# Patient Record
Sex: Female | Born: 1950
Health system: Southern US, Community
[De-identification: ages and names within clinical notes are randomized; demographics above are authoritative.]

## PROBLEM LIST (undated history)

## (undated) DIAGNOSIS — E785 Hyperlipidemia, unspecified: Secondary | ICD-10-CM

## (undated) DIAGNOSIS — E669 Obesity, unspecified: Secondary | ICD-10-CM

## (undated) DIAGNOSIS — K219 Gastro-esophageal reflux disease without esophagitis: Secondary | ICD-10-CM

## (undated) DIAGNOSIS — T7840XA Allergy, unspecified, initial encounter: Secondary | ICD-10-CM

## (undated) HISTORY — DX: Hyperlipidemia, unspecified: E78.5

## (undated) HISTORY — DX: Allergy, unspecified, initial encounter: T78.40XA

## (undated) HISTORY — DX: Obesity, unspecified: E66.9

## (undated) HISTORY — DX: Gastro-esophageal reflux disease without esophagitis: K21.9

## (undated) HISTORY — PX: HERNIA REPAIR: SHX51

---

## 1981-02-17 HISTORY — PX: ABDOMINAL HYSTERECTOMY: SHX81

## 2008-02-03 ENCOUNTER — Ambulatory Visit: Payer: Self-pay | Admitting: Family Medicine

## 2008-02-15 ENCOUNTER — Ambulatory Visit: Payer: Self-pay | Admitting: Gastroenterology

## 2008-02-18 HISTORY — PX: COLONOSCOPY: SHX174

## 2008-03-02 ENCOUNTER — Ambulatory Visit: Payer: Self-pay | Admitting: Gastroenterology

## 2008-03-02 LAB — HM COLONOSCOPY: HM Colonoscopy: NORMAL

## 2008-07-20 ENCOUNTER — Ambulatory Visit: Payer: Self-pay | Admitting: Family Medicine

## 2008-11-09 ENCOUNTER — Ambulatory Visit: Payer: Self-pay | Admitting: Family Medicine

## 2009-03-13 ENCOUNTER — Emergency Department (HOSPITAL_COMMUNITY): Admission: EM | Admit: 2009-03-13 | Discharge: 2009-03-13 | Payer: Self-pay | Admitting: Emergency Medicine

## 2009-08-28 ENCOUNTER — Ambulatory Visit: Payer: Self-pay | Admitting: Family Medicine

## 2010-02-25 LAB — HM MAMMOGRAPHY: HM Mammogram: NEGATIVE

## 2010-09-01 ENCOUNTER — Other Ambulatory Visit: Payer: Self-pay | Admitting: Family Medicine

## 2010-09-17 ENCOUNTER — Other Ambulatory Visit: Payer: Self-pay | Admitting: Family Medicine

## 2010-10-15 ENCOUNTER — Other Ambulatory Visit: Payer: Self-pay | Admitting: Family Medicine

## 2010-10-22 ENCOUNTER — Encounter: Payer: Self-pay | Admitting: Family Medicine

## 2010-10-24 ENCOUNTER — Ambulatory Visit (INDEPENDENT_AMBULATORY_CARE_PROVIDER_SITE_OTHER): Payer: Managed Care, Other (non HMO) | Admitting: Family Medicine

## 2010-10-24 ENCOUNTER — Encounter: Payer: Self-pay | Admitting: Family Medicine

## 2010-10-24 VITALS — BP 148/90 | HR 78 | Wt 226.0 lb

## 2010-10-24 DIAGNOSIS — K219 Gastro-esophageal reflux disease without esophagitis: Secondary | ICD-10-CM | POA: Insufficient documentation

## 2010-10-24 DIAGNOSIS — J309 Allergic rhinitis, unspecified: Secondary | ICD-10-CM

## 2010-10-24 DIAGNOSIS — E785 Hyperlipidemia, unspecified: Secondary | ICD-10-CM

## 2010-10-24 DIAGNOSIS — E669 Obesity, unspecified: Secondary | ICD-10-CM

## 2010-10-24 DIAGNOSIS — Z79899 Other long term (current) drug therapy: Secondary | ICD-10-CM

## 2010-10-24 LAB — CBC WITH DIFFERENTIAL/PLATELET
Basophils Absolute: 0.1 10*3/uL (ref 0.0–0.1)
Eosinophils Absolute: 0.1 10*3/uL (ref 0.0–0.7)
Eosinophils Relative: 3 % (ref 0–5)
Lymphocytes Relative: 26 % (ref 12–46)
Lymphs Abs: 1.1 10*3/uL (ref 0.7–4.0)
Neutro Abs: 2.6 10*3/uL (ref 1.7–7.7)
Neutrophils Relative %: 60 % (ref 43–77)
Platelets: 268 10*3/uL (ref 150–400)
RBC: 4.73 MIL/uL (ref 3.87–5.11)
WBC: 4.4 10*3/uL (ref 4.0–10.5)

## 2010-10-24 NOTE — Progress Notes (Signed)
  Subjective:    Patient ID: Sarah Dominguez, female    DOB: December 03, 1950, 60 y.o.   MRN: [id number]  HPI She is here for a complete examination. She does complain of some slight discomfort in the right third finger at the DIP joint. She also continues on Nexium and notes that she can get 3 days worth of use out of before the symptoms started to recur. Her allergies seem to be under good control. Her exercise is quite minimal. She does seem to keep his he with her daily activities. Her marriage is going well. She does wear her seatbelt.   Review of Systems  Constitutional: Negative.   HENT: Negative.   Eyes: Negative.   Respiratory: Negative.   Cardiovascular: Negative.   Gastrointestinal: Negative.   Genitourinary: Negative.   Musculoskeletal: Negative.   Skin: Negative.   Neurological: Negative.   Hematological: Negative.   Psychiatric/Behavioral: Negative.        Objective:   Physical Exam BP 148/90  Pulse 78  Wt 226 lb (102.513 kg)  General Appearance:    Alert, cooperative, no distress, appears stated age  Head:    Normocephalic, without obvious abnormality, atraumatic  Eyes:    PERRL, conjunctiva/corneas clear, EOM's intact, fundi    benign  Ears:    Normal TM's and external ear canals  Nose:   Nares normal, mucosa normal, no drainage or sinus   tenderness  Throat:   Lips, mucosa, and tongue normal; teeth and gums normal  Neck:   Supple, no lymphadenopathy;  thyroid:  no   enlargement/tenderness/nodules; no carotid   bruit or JVD  Back:    Spine nontender, no curvature, ROM normal, no CVA     tenderness  Lungs:     Clear to auscultation bilaterally without wheezes, rales or     ronchi; respirations unlabored  Chest Wall:    No tenderness or deformity   Heart:    Regular rate and rhythm, S1 and S2 normal, no murmur, rub   or gallop  Breast Exam:    Deferred to GYN  Abdomen:     Soft, non-tender, nondistended, normoactive bowel sounds,    no masses, no hepatosplenomegaly   Genitalia:    Deferred to GYN     Extremities:   No clubbing, cyanosis or edema  Pulses:   2+ and symmetric all extremities  Skin:   Skin color, texture, turgor normal, no rashes or lesions  Lymph nodes:   Cervical, supraclavicular, and axillary nodes normal  Neurologic:   CNII-XII intact, normal strength, sensation and gait; reflexes 2+ and symmetric throughout          Psych:   Normal mood, affect, hygiene and grooming.           Assessment & Plan:   1. Hyperlipidemia LDL goal < 100  Lipid panel  2. GERD (gastroesophageal reflux disease)    3. Allergic rhinitis    4. Obesity (BMI 30-39.9)    5. Encounter for long-term (current) use of other medications  CBC with Differential, Comp Met (CMET), Lipid panel

## 2010-10-24 NOTE — Patient Instructions (Signed)
Use the Nexium every 3 days to help with your symptoms.

## 2010-10-25 ENCOUNTER — Telehealth: Payer: Self-pay

## 2010-10-25 LAB — COMPREHENSIVE METABOLIC PANEL
CO2: 26 mEq/L (ref 19–32)
Calcium: 10.3 mg/dL (ref 8.4–10.5)
Chloride: 106 mEq/L (ref 96–112)
Creat: 0.89 mg/dL (ref 0.50–1.10)
Glucose, Bld: 99 mg/dL (ref 70–99)
Sodium: 142 mEq/L (ref 135–145)
Total Bilirubin: 0.4 mg/dL (ref 0.3–1.2)
Total Protein: 7 g/dL (ref 6.0–8.3)

## 2010-10-25 LAB — LIPID PANEL
Cholesterol: 208 mg/dL — ABNORMAL HIGH (ref 0–200)
HDL: 59 mg/dL (ref >39)
Triglycerides: 113 mg/dL (ref <150)

## 2010-10-25 NOTE — Telephone Encounter (Signed)
Pt informed labs ok 

## 2010-10-28 ENCOUNTER — Other Ambulatory Visit: Payer: Self-pay | Admitting: Family Medicine

## 2010-10-30 ENCOUNTER — Telehealth: Payer: Self-pay | Admitting: Family Medicine

## 2010-10-31 ENCOUNTER — Other Ambulatory Visit: Payer: Self-pay

## 2010-10-31 MED ORDER — SIMVASTATIN 40 MG PO TABS: 40.0000 mg | ORAL_TABLET | Freq: Every day | ORAL | Status: DC

## 2010-11-12 NOTE — Telephone Encounter (Signed)
DONE

## 2010-11-29 ENCOUNTER — Other Ambulatory Visit: Payer: Self-pay | Admitting: Family Medicine

## 2010-12-09 ENCOUNTER — Other Ambulatory Visit: Payer: Self-pay | Admitting: Family Medicine

## 2011-01-27 ENCOUNTER — Other Ambulatory Visit: Payer: Self-pay | Admitting: Family Medicine

## 2011-03-29 ENCOUNTER — Other Ambulatory Visit: Payer: Self-pay | Admitting: Family Medicine

## 2011-06-24 ENCOUNTER — Other Ambulatory Visit: Payer: Self-pay | Admitting: Family Medicine

## 2011-10-22 ENCOUNTER — Other Ambulatory Visit: Payer: Self-pay | Admitting: Family Medicine

## 2011-12-05 ENCOUNTER — Other Ambulatory Visit: Payer: Self-pay | Admitting: Family Medicine

## 2011-12-29 ENCOUNTER — Telehealth: Payer: Self-pay | Admitting: Family Medicine

## 2011-12-29 MED ORDER — ESOMEPRAZOLE MAGNESIUM 40 MG PO CPDR: 40.0000 mg | DELAYED_RELEASE_CAPSULE | Freq: Every day | ORAL | Status: DC

## 2011-12-29 MED ORDER — SIMVASTATIN 40 MG PO TABS: 40.0000 mg | ORAL_TABLET | Freq: Every day | ORAL | Status: DC

## 2011-12-29 NOTE — Telephone Encounter (Signed)
Med sent.

## 2012-01-08 ENCOUNTER — Encounter: Payer: Self-pay | Admitting: Family Medicine

## 2012-01-08 ENCOUNTER — Ambulatory Visit (INDEPENDENT_AMBULATORY_CARE_PROVIDER_SITE_OTHER): Payer: Managed Care, Other (non HMO) | Admitting: Family Medicine

## 2012-01-08 VITALS — BP 118/80 | HR 74 | Ht 68.0 in | Wt 224.0 lb

## 2012-01-08 DIAGNOSIS — Z2911 Encounter for prophylactic immunotherapy for respiratory syncytial virus (RSV): Secondary | ICD-10-CM

## 2012-01-08 DIAGNOSIS — E785 Hyperlipidemia, unspecified: Secondary | ICD-10-CM

## 2012-01-08 DIAGNOSIS — J309 Allergic rhinitis, unspecified: Secondary | ICD-10-CM

## 2012-01-08 DIAGNOSIS — K219 Gastro-esophageal reflux disease without esophagitis: Secondary | ICD-10-CM

## 2012-01-08 DIAGNOSIS — Z0000 Encounter for general adult medical examination without abnormal findings: Secondary | ICD-10-CM

## 2012-01-08 DIAGNOSIS — E669 Obesity, unspecified: Secondary | ICD-10-CM

## 2012-01-08 LAB — LIPID PANEL
HDL: 63 mg/dL (ref >39)
LDL Cholesterol: 138 mg/dL — ABNORMAL HIGH (ref 0–99)

## 2012-01-08 LAB — COMPREHENSIVE METABOLIC PANEL
ALT: 17 U/L (ref 0–35)
BUN: 19 mg/dL (ref 6–23)
CO2: 30 mEq/L (ref 19–32)
Chloride: 105 mEq/L (ref 96–112)
Creat: 0.93 mg/dL (ref 0.50–1.10)
Glucose, Bld: 82 mg/dL (ref 70–99)
Total Protein: 6.9 g/dL (ref 6.0–8.3)

## 2012-01-08 LAB — CBC WITH DIFFERENTIAL/PLATELET
Eosinophils Absolute: 0.1 10*3/uL (ref 0.0–0.7)
Eosinophils Relative: 2 % (ref 0–5)
HCT: 42.9 % (ref 36.0–46.0)
Hemoglobin: 14.4 g/dL (ref 12.0–15.0)
Lymphs Abs: 1.3 10*3/uL (ref 0.7–4.0)
MCH: 30.9 pg (ref 26.0–34.0)
Monocytes Relative: 10 % (ref 3–12)
Neutro Abs: 3.1 10*3/uL (ref 1.7–7.7)
Neutrophils Relative %: 61 % (ref 43–77)
RBC: 4.66 MIL/uL (ref 3.87–5.11)

## 2012-01-08 NOTE — Progress Notes (Signed)
  Subjective:    Patient ID: Sarah Dominguez, female    DOB: [birth year], 61 y.o.   MRN: [id number]  HPI She is here for complete examination. She continues on Zocor and is having no difficulty with this. She does use Nexium but notes that she can get by using it less than every day. She continues on omega-3. Her allergies are under good control. She is slowly losing weight making changes especially to her exercise regimen. She sees a gynecologist regularly. Last colonoscopy was 2010.   Review of Systems  Constitutional: Negative.   HENT: Negative.   Eyes: Negative.   Respiratory: Negative.   Cardiovascular: Negative.   Gastrointestinal: Negative.   Genitourinary: Negative.   Musculoskeletal: Negative.   Hematological: Negative.   Psychiatric/Behavioral: Negative.        Objective:   Physical Exam BP 118/80  Pulse 74  Ht 5\' 8"  (1.727 m)  Wt 224 lb (101.606 kg)  BMI 34.06 kg/m2  SpO2 98%  General Appearance:    Alert, cooperative, no distress, appears stated age  Head:    Normocephalic, without obvious abnormality, atraumatic  Eyes:    PERRL, conjunctiva/corneas clear, EOM's intact, fundi    benign  Ears:    Normal TM's and external ear canals  Nose:   Nares normal, mucosa normal, no drainage or sinus   tenderness  Throat:   Lips, mucosa, and tongue normal; teeth and gums normal  Neck:   Supple, no lymphadenopathy;  thyroid:  no   enlargement/tenderness/nodules; no carotid   bruit or JVD  Back:    Spine nontender, no curvature, ROM normal, no CVA     tenderness  Lungs:     Clear to auscultation bilaterally without wheezes, rales or     ronchi; respirations unlabored  Chest Wall:    No tenderness or deformity   Heart:    Regular rate and rhythm, S1 and S2 normal, no murmur, rub   or gallop  Breast Exam:    Deferred to GYN  Abdomen:     Soft, non-tender, nondistended, normoactive bowel sounds,    no masses, no hepatosplenomegaly  Genitalia:    Deferred to GYN       Extremities:   No clubbing, cyanosis or edema  Pulses:   2+ and symmetric all extremities  Skin:   Skin color, texture, turgor normal, no rashes or lesions  Lymph nodes:   Cervical, supraclavicular, and axillary nodes normal  Neurologic:   CNII-XII intact, normal strength, sensation and gait; reflexes 2+ and symmetric throughout          Psych:   Normal mood, affect, hygiene and grooming.           Assessment & Plan:   1. Routine general medical examination at a health care facility  Varicella-zoster vaccine subcutaneous, Lipid panel, CBC with Differential, Comprehensive metabolic panel  2. GERD (gastroesophageal reflux disease)    3. Obesity (BMI 30-39.9)    4. Hyperlipidemia LDL goal < 100  Lipid panel  5. Allergic rhinitis     shingles vaccine given with discussion of risks and benefits. Recommend she use Nexium more on an as-needed basis. Encouraged her to continue with her weight reduction. Discussed diet and exercise especially in regard to cutting back on carbohydrates. Continue to treat her allergies as needed. Stool cards given.

## 2012-01-09 MED ORDER — ATORVASTATIN CALCIUM 40 MG PO TABS: 40.0000 mg | ORAL_TABLET | Freq: Every day | ORAL | Status: DC

## 2012-01-09 NOTE — Addendum Note (Signed)
Addended by: Ronnald Nian on: 01/09/2012 08:43 AM   Modules accepted: Orders

## 2012-01-09 NOTE — Progress Notes (Signed)
Quick Note:  Her lipids are not at goal. I called in a different for her to be taking and have her return here in 2 months ______

## 2012-01-09 NOTE — Progress Notes (Signed)
Quick Note:  Called pt home # left message her lipids are not a goal JCL had called in a different med for her to start taking and for her to return in 2 mths ______

## 2012-02-26 ENCOUNTER — Encounter: Payer: Self-pay | Admitting: Internal Medicine

## 2012-03-09 ENCOUNTER — Other Ambulatory Visit: Payer: Self-pay | Admitting: Family Medicine

## 2012-03-11 ENCOUNTER — Encounter: Payer: Self-pay | Admitting: Family Medicine

## 2012-03-11 ENCOUNTER — Ambulatory Visit (INDEPENDENT_AMBULATORY_CARE_PROVIDER_SITE_OTHER): Payer: BC Managed Care – PPO | Admitting: Family Medicine

## 2012-03-11 VITALS — BP 138/88 | HR 72 | Ht 68.0 in | Wt 227.0 lb

## 2012-03-11 DIAGNOSIS — Z79899 Other long term (current) drug therapy: Secondary | ICD-10-CM

## 2012-03-11 DIAGNOSIS — E785 Hyperlipidemia, unspecified: Secondary | ICD-10-CM

## 2012-03-11 LAB — LIPID PANEL
HDL: 59 mg/dL (ref >39)
Total CHOL/HDL Ratio: 2.7 Ratio

## 2012-03-11 NOTE — Progress Notes (Signed)
  Subjective:    Patient ID: Sarah Dominguez, female    DOB: Jan 09, 1951, 62 y.o.   MRN: [id number]  HPI She is here for a medication check. On her last visit her lipid panel was not at goal. She was switched to Lipitor 40 mg. She does not complain of weakness, muscle aches or neurologic symptoms. Also discussed flu shot and she declined.  Review of Systems     Objective:   Physical Exam Alert and in no distress       Assessment & Plan:   1. Hyperlipidemia LDL goal < 100  Lipid panel, Comprehensive metabolic panel  2. Encounter for long-term (current) use of other medications  Lipid panel, Comprehensive metabolic panel

## 2012-03-12 LAB — COMPREHENSIVE METABOLIC PANEL
ALT: 18 U/L (ref 0–35)
Albumin: 4.6 g/dL (ref 3.5–5.2)
Alkaline Phosphatase: 72 U/L (ref 39–117)
CO2: 27 mEq/L (ref 19–32)
Glucose, Bld: 105 mg/dL — ABNORMAL HIGH (ref 70–99)
Potassium: 4.8 mEq/L (ref 3.5–5.3)
Sodium: 142 mEq/L (ref 135–145)
Total Bilirubin: 0.5 mg/dL (ref 0.3–1.2)
Total Protein: 6.8 g/dL (ref 6.0–8.3)

## 2012-03-12 NOTE — Progress Notes (Signed)
Quick Note:  CALLED PT HOME # PT WAS ADVISED OF BLOOD WORK LOOKS GOOD CONTINUE ON PRESENT MEDS PT VERBALIZED UNDERSTANDING ______

## 2012-04-03 ENCOUNTER — Other Ambulatory Visit: Payer: Self-pay

## 2012-05-04 ENCOUNTER — Other Ambulatory Visit: Payer: Self-pay | Admitting: Family Medicine

## 2012-06-10 ENCOUNTER — Other Ambulatory Visit: Payer: Self-pay | Admitting: Family Medicine

## 2012-09-13 ENCOUNTER — Other Ambulatory Visit: Payer: Self-pay | Admitting: Family Medicine

## 2012-10-04 ENCOUNTER — Emergency Department (HOSPITAL_COMMUNITY)
Admission: EM | Admit: 2012-10-04 | Discharge: 2012-10-04 | Disposition: A | Discharge status: Home / Self Care | Payer: BC Managed Care – PPO | Attending: Emergency Medicine | Admitting: Emergency Medicine

## 2012-10-04 ENCOUNTER — Encounter (HOSPITAL_COMMUNITY): Payer: Self-pay | Admitting: Emergency Medicine

## 2012-10-04 ENCOUNTER — Emergency Department (HOSPITAL_COMMUNITY): Payer: BC Managed Care – PPO

## 2012-10-04 ENCOUNTER — Ambulatory Visit: Payer: Self-pay | Admitting: Family Medicine

## 2012-10-04 ENCOUNTER — Telehealth (HOSPITAL_COMMUNITY): Payer: Self-pay | Admitting: *Deleted

## 2012-10-04 DIAGNOSIS — E669 Obesity, unspecified: Secondary | ICD-10-CM | POA: Insufficient documentation

## 2012-10-04 DIAGNOSIS — M549 Dorsalgia, unspecified: Secondary | ICD-10-CM | POA: Insufficient documentation

## 2012-10-04 DIAGNOSIS — K219 Gastro-esophageal reflux disease without esophagitis: Secondary | ICD-10-CM | POA: Insufficient documentation

## 2012-10-04 DIAGNOSIS — R059 Cough, unspecified: Secondary | ICD-10-CM | POA: Insufficient documentation

## 2012-10-04 DIAGNOSIS — Z79899 Other long term (current) drug therapy: Secondary | ICD-10-CM | POA: Insufficient documentation

## 2012-10-04 DIAGNOSIS — E785 Hyperlipidemia, unspecified: Secondary | ICD-10-CM | POA: Insufficient documentation

## 2012-10-04 DIAGNOSIS — R0789 Other chest pain: Secondary | ICD-10-CM | POA: Insufficient documentation

## 2012-10-04 DIAGNOSIS — R05 Cough: Secondary | ICD-10-CM | POA: Insufficient documentation

## 2012-10-04 LAB — COMPREHENSIVE METABOLIC PANEL
BUN: 18 mg/dL (ref 6–23)
CO2: 26 mEq/L (ref 19–32)
Chloride: 104 mEq/L (ref 96–112)
Creatinine, Ser: 0.78 mg/dL (ref 0.50–1.10)
GFR calc Af Amer: >90 mL/min (ref >90)
GFR calc non Af Amer: 88 mL/min — ABNORMAL LOW (ref >90)
Total Bilirubin: 0.4 mg/dL (ref 0.3–1.2)

## 2012-10-04 LAB — CBC WITH DIFFERENTIAL/PLATELET
Basophils Absolute: 0.1 10*3/uL (ref 0.0–0.1)
Basophils Relative: 1 % (ref 0–1)
HCT: 41.5 % (ref 36.0–46.0)
Hemoglobin: 14.4 g/dL (ref 12.0–15.0)
Lymphocytes Relative: 24 % (ref 12–46)
MCHC: 34.7 g/dL (ref 30.0–36.0)
Monocytes Absolute: 0.6 10*3/uL (ref 0.1–1.0)
Neutro Abs: 2.7 10*3/uL (ref 1.7–7.7)
Neutrophils Relative %: 59 % (ref 43–77)
RDW: 12.6 % (ref 11.5–15.5)
WBC: 4.6 10*3/uL (ref 4.0–10.5)

## 2012-10-04 LAB — URINALYSIS, ROUTINE W REFLEX MICROSCOPIC
Bilirubin Urine: NEGATIVE
Hgb urine dipstick: NEGATIVE
Specific Gravity, Urine: 1.011 (ref 1.005–1.030)
pH: 6 (ref 5.0–8.0)

## 2012-10-04 LAB — D-DIMER, QUANTITATIVE: D-Dimer, Quant: 0.29 ug/mL-FEU (ref 0.00–0.48)

## 2012-10-04 MED ORDER — CYCLOBENZAPRINE HCL 10 MG PO TABS: 5.0000 mg | ORAL_TABLET | Freq: Two times a day (BID) | ORAL | Status: DC | PRN

## 2012-10-04 MED ORDER — MORPHINE SULFATE 4 MG/ML IJ SOLN
4.0000 mg | Freq: Once | INTRAMUSCULAR | Status: AC
  Administered 2012-10-04: 4 mg via INTRAVENOUS
  Filled 2012-10-04: qty 1

## 2012-10-04 MED ORDER — TRAMADOL HCL 50 MG PO TABS: 50.0000 mg | ORAL_TABLET | Freq: Four times a day (QID) | ORAL | Status: DC | PRN

## 2012-10-04 MED ORDER — IBUPROFEN 600 MG PO TABS: 600.0000 mg | ORAL_TABLET | Freq: Four times a day (QID) | ORAL | Status: DC | PRN

## 2012-10-04 NOTE — ED Provider Notes (Signed)
CSN: [id number]     Arrival date & time 10/04/12  4098 History     First MD Initiated Contact with Patient 10/04/12 [phone]     Chief Complaint  Patient presents with  . Flank Pain   (Consider location/radiation/quality/duration/timing/severity/associated sxs/prior Treatment) Patient is a 62 y.o. female presenting with flank pain. The history is provided by the patient.  Flank Pain This is a new problem. Episode onset: 4 days ago. The problem occurs constantly. The problem has been unchanged. Associated symptoms include coughing. Pertinent negatives include no abdominal pain, chest pain, chills, congestion, diaphoresis, fatigue, fever, headaches, nausea, neck pain, numbness, rash, urinary symptoms, vomiting or weakness. The symptoms are aggravated by bending. She has tried nothing for the symptoms. The treatment provided no relief.    Past Medical History  Diagnosis Date  . Allergy     RHINITIS  . GERD (gastroesophageal reflux disease)   . Dyslipidemia   . Obesity    Past Surgical History  Procedure Laterality Date  . Abdominal hysterectomy  1983  . Hernia repair  1978/1984  . Colonoscopy  2010    Dr.kaplan   Family History  Problem Relation Age of Onset  . Kidney disease Mother     Renal failure  . Cancer Father 54    Non-Hodgkin's lymphoma   History  Substance Use Topics  . Smoking status: Never Smoker   . Smokeless tobacco: Never Used  . Alcohol Use: 1.8 oz/week    3 Glasses of wine per week   OB History   Grav Para Term Preterm Abortions TAB SAB Ect Mult Living                 Review of Systems  Constitutional: Negative for fever, chills, diaphoresis, appetite change and fatigue.  HENT: Negative for congestion, neck pain and neck stiffness.   Respiratory: Positive for cough. Negative for chest tightness, shortness of breath and wheezing.   Cardiovascular: Negative for chest pain, palpitations and leg swelling.  Gastrointestinal: Negative for nausea, vomiting,  abdominal pain and abdominal distention.  Genitourinary: Positive for flank pain. Negative for dysuria, urgency, frequency, hematuria, decreased urine volume and difficulty urinating.  Musculoskeletal: Positive for back pain. Negative for gait problem.  Skin: Negative for rash.  Neurological: Negative for dizziness, syncope, weakness, light-headedness, numbness and headaches.  All other systems reviewed and are negative.    Allergies  Review of patient's allergies indicates no known allergies.  Home Medications   Current Outpatient Rx  Name  Route  Sig  Dispense  Refill  . atorvastatin (LIPITOR) 40 MG tablet   Oral   Take 40 mg by mouth daily.         Marland Kitchen esomeprazole (NEXIUM) 40 MG capsule   Oral   Take 40 mg by mouth daily before breakfast.         . cyclobenzaprine (FLEXERIL) 10 MG tablet   Oral   Take 0.5 tablets (5 mg total) by mouth 2 (two) times daily as needed for muscle spasms.   10 tablet   0   . ibuprofen (ADVIL,MOTRIN) 600 MG tablet   Oral   Take 1 tablet (600 mg total) by mouth every 6 (six) hours as needed for pain.   30 tablet   0   . traMADol (ULTRAM) 50 MG tablet   Oral   Take 1 tablet (50 mg total) by mouth every 6 (six) hours as needed for pain.   10 tablet   0    BP  136/54  Pulse 67  Temp(Src) 97.8 F (36.6 C) (Oral)  Resp 14  Ht 5\' 9"  (1.753 m)  Wt 223 lb (101.152 kg)  BMI 32.92 kg/m2  SpO2 96% Physical Exam  Nursing note and vitals reviewed. Constitutional: She is oriented to person, place, and time. She appears well-developed and well-nourished. No distress.  HENT:  Head: Normocephalic and atraumatic.  Eyes: EOM are normal. Pupils are equal, round, and reactive to light.  Neck: Normal range of motion. Neck supple.  Cardiovascular: Normal rate, regular rhythm, normal heart sounds and intact distal pulses.   Pulmonary/Chest: Effort normal and breath sounds normal. No respiratory distress. She has no decreased breath sounds. She has no  wheezes. She has no rhonchi. She has no rales.   She exhibits tenderness.    Abdominal: Soft. Bowel sounds are normal. She exhibits no distension and no mass. There is no tenderness. There is no rebound and no guarding.  Musculoskeletal: Normal range of motion. She exhibits no edema and no tenderness.  Neurological: She is alert and oriented to person, place, and time. She has normal strength. She exhibits normal muscle tone. Coordination and gait normal.  Skin: Skin is warm and dry. No rash noted. She is not diaphoretic.    ED Course   Procedures (including critical care time)  Labs Reviewed  CBC WITH DIFFERENTIAL - Abnormal; Notable for the following:    Monocytes Relative 13 (*)    All other components within normal limits  COMPREHENSIVE METABOLIC PANEL - Abnormal; Notable for the following:    GFR calc non Af Amer 88 (*)    All other components within normal limits  URINALYSIS, ROUTINE W REFLEX MICROSCOPIC - Abnormal; Notable for the following:    Leukocytes, UA TRACE (*)    All other components within normal limits  URINE MICROSCOPIC-ADD ON  D-DIMER, QUANTITATIVE   Dg Chest 2 View  10/04/2012   *RADIOLOGY REPORT*  Clinical Data: Left chest pain  CHEST - 2 VIEW  Comparison: None.  Findings: The lungs are clear without focal infiltrate, edema, pneumothorax or pleural effusion. Interstitial markings are diffusely coarsened with chronic features. The cardiopericardial silhouette is within normal limits for size. Imaged bony structures of the thorax are intact.  IMPRESSION: No acute cardiopulmonary process.   Original Report Authenticated By: Kennith Center, M.D.   1. Musculoskeletal chest pain      Date: 10/04/2012  Rate: 61  Rhythm: normal sinus rhythm  QRS Axis: normal  Intervals: normal  ST/T Wave abnormalities: nonspecific ST changes  Conduction Disutrbances:none  Narrative Interpretation:   Old EKG Reviewed: none available   MDM  62 y.o. F presenting with left flank  pain.  Symptom onset 4 days ago.  Pt states she was lifting weights 4 days ago, and felt pain following this.  She states was not lifting more weight than usual.  Pain worsened over past 2 days so pt presented to ED.  Pain located in left anterior and posterior lower ribs.  Pain worse with deep breaths and movement.  She reports mild cough productive of yellow sputum.  No fevers.  No chest pain, no N/V.  No dysuria.  Pt does state that she went on car trip to [place] recently.  She denies LE swelling or pain.  No prior h/o VTE.  Physical exam with no tachycardia, tachypnea, or hypoxia.  Lower anterior, lateral, and posterior rib tenderness to palpation.  Lungs CTAB.  Abdomen soft, non-distended, non-tender.   Possible PNA vs PE vs  nephrolithiasis vs MSK pain.  History atypical for ACS, will check EKG.  D-dimer and CXR ordered.  U/A ordered to assess for kidney stone or UTI.  No rashes on exam to suggest shingles.  EKG with no evidence of acute ischemia.  CXR WNL.  U/A negative for blood or UTI.  D-dimer negative.  Symptoms likely 2/2 MSK chest pain following lifting weights.  Ultram, flexeril, ibuprofen prescribed.  ED return precautions given.  No further questions or concerns, stable at discharge.  Discussed with attending Dr. Ranae Palms.  Jodean Lima, MD 10/04/12 [phone]

## 2012-10-04 NOTE — ED Notes (Signed)
MD at bedside. 

## 2012-10-04 NOTE — ED Notes (Signed)
Received pt from home with c/o left side/flank pain since Friday. Pain increases with movement and deep inspiration.

## 2012-10-05 NOTE — ED Provider Notes (Signed)
Pt with several days of L lower rib pain, worse with movement and palpation. No def trauma. Pain did start after working out on Thursday and worsened when picking up her granddaughter yesterday. No cough, SOB, fever, chills, lower ext swelling or pain. Work up neg including normal d-dimer. Suspect MSK cause to pain. Will treat symptomatically. Return precautions given  Loren Racer, MD 10/05/12 [phone]

## 2012-12-23 ENCOUNTER — Other Ambulatory Visit: Payer: Self-pay

## 2013-04-18 ENCOUNTER — Telehealth: Payer: Self-pay | Admitting: Family Medicine

## 2013-04-18 MED ORDER — ESOMEPRAZOLE MAGNESIUM 40 MG PO CPDR: 40.0000 mg | DELAYED_RELEASE_CAPSULE | Freq: Every day | ORAL | Status: DC

## 2013-04-18 NOTE — Telephone Encounter (Signed)
Medication sent in. 

## 2013-08-22 LAB — HM MAMMOGRAPHY

## 2013-08-23 ENCOUNTER — Encounter: Payer: Self-pay | Admitting: Internal Medicine

## 2013-10-21 IMAGING — CR DG CHEST 2V
2 series · 2 of 2 positions shown · non-contrast
Comparison: None.

CLINICAL DATA: Left chest pain

CHEST - 2 VIEW

[w chest pa]
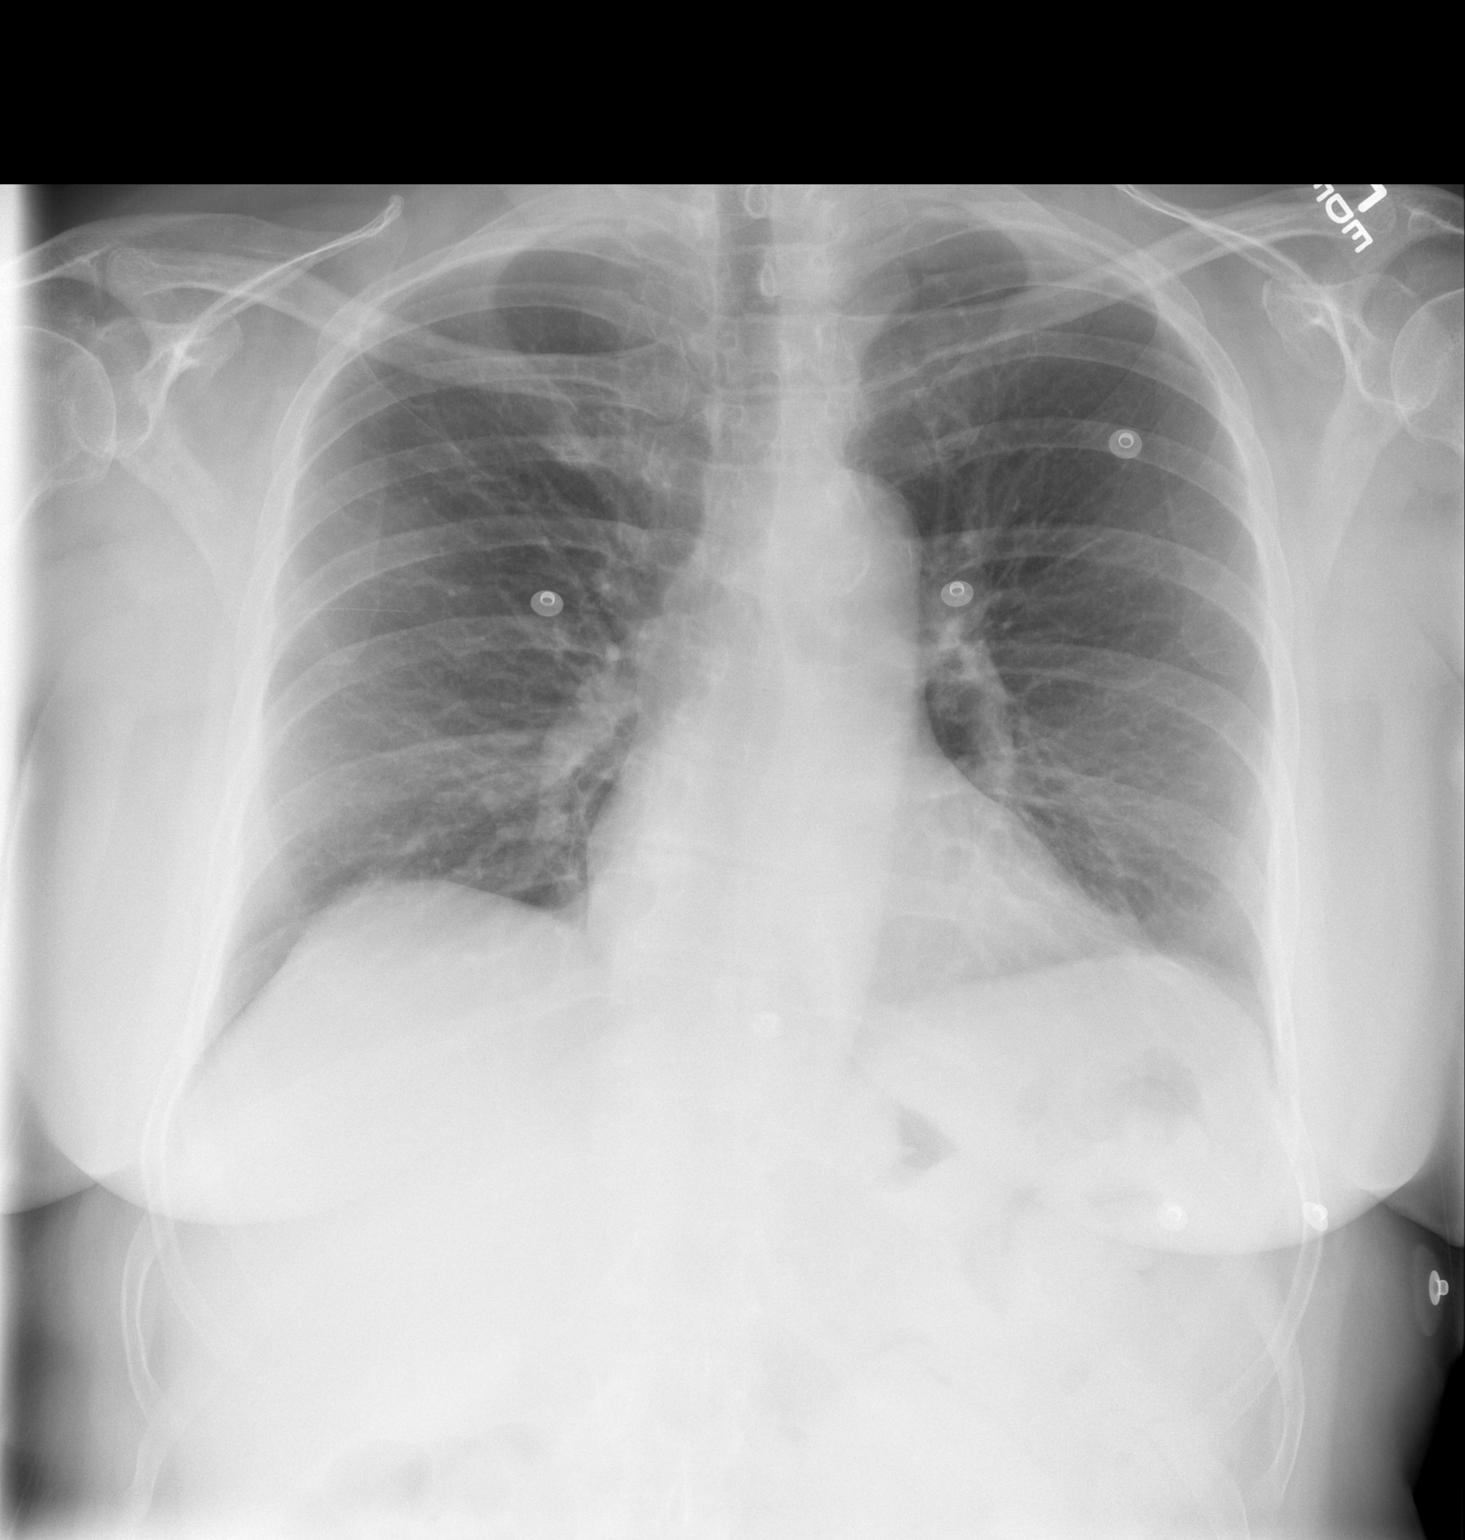

[w chest lat]
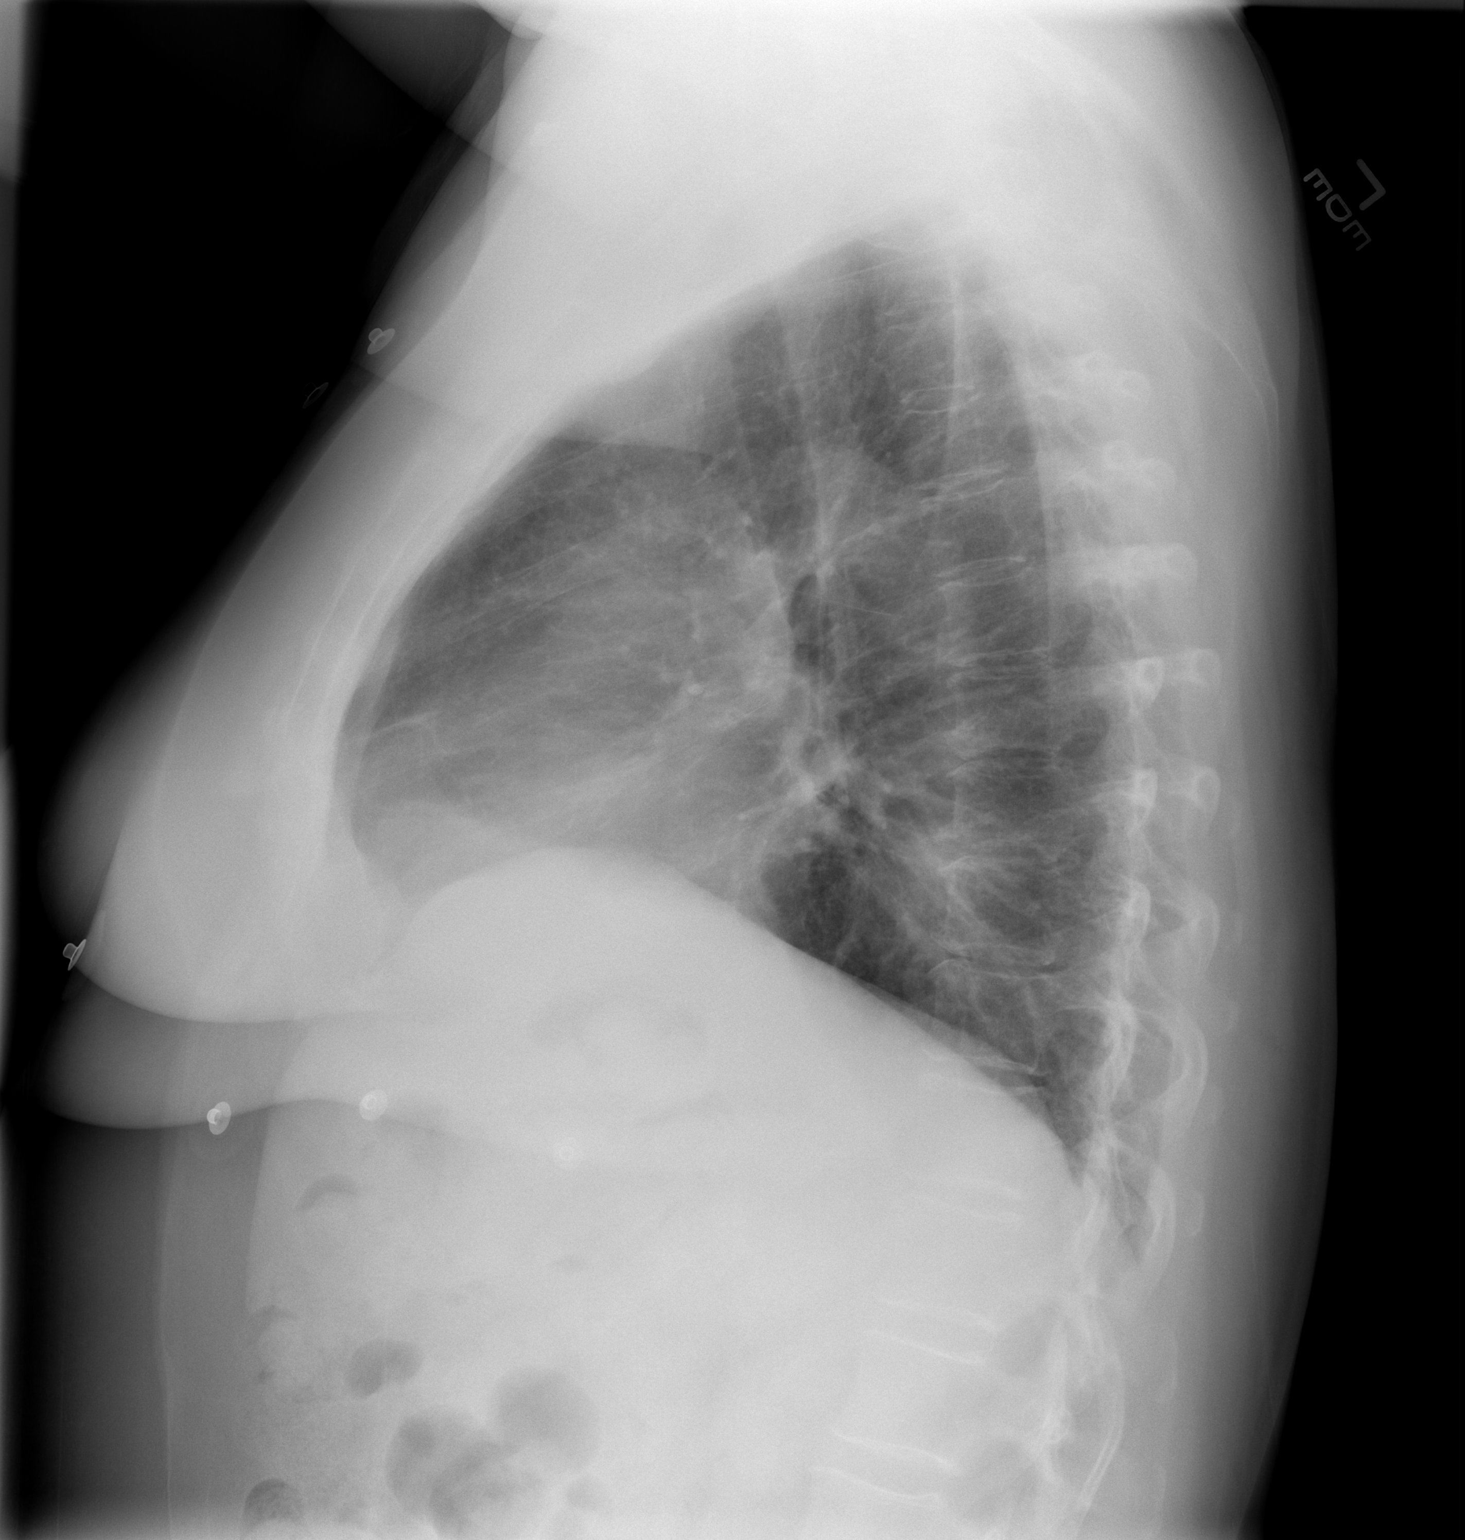

[2 of 2 positions shown; findings below may reference images not displayed]

FINDINGS: The lungs are clear without focal infiltrate, edema,
pneumothorax or pleural effusion. Interstitial markings are
diffusely coarsened with chronic features. The cardiopericardial
silhouette is within normal limits for size. Imaged bony structures
of the thorax are intact.
IMPRESSION: No acute cardiopulmonary process.

## 2013-11-13 ENCOUNTER — Other Ambulatory Visit: Payer: Self-pay | Admitting: Family Medicine

## 2013-11-16 ENCOUNTER — Encounter: Payer: Self-pay | Admitting: Family Medicine

## 2013-11-16 ENCOUNTER — Ambulatory Visit (INDEPENDENT_AMBULATORY_CARE_PROVIDER_SITE_OTHER): Payer: BC Managed Care – PPO | Admitting: Family Medicine

## 2013-11-16 VITALS — BP 130/84 | HR 77 | Ht 68.5 in | Wt 228.0 lb

## 2013-11-16 DIAGNOSIS — Z23 Encounter for immunization: Secondary | ICD-10-CM

## 2013-11-16 DIAGNOSIS — J3089 Other allergic rhinitis: Secondary | ICD-10-CM

## 2013-11-16 DIAGNOSIS — K219 Gastro-esophageal reflux disease without esophagitis: Secondary | ICD-10-CM

## 2013-11-16 DIAGNOSIS — J302 Other seasonal allergic rhinitis: Secondary | ICD-10-CM

## 2013-11-16 DIAGNOSIS — Z0000 Encounter for general adult medical examination without abnormal findings: Secondary | ICD-10-CM

## 2013-11-16 DIAGNOSIS — E669 Obesity, unspecified: Secondary | ICD-10-CM

## 2013-11-16 DIAGNOSIS — B351 Tinea unguium: Secondary | ICD-10-CM

## 2013-11-16 DIAGNOSIS — E785 Hyperlipidemia, unspecified: Secondary | ICD-10-CM

## 2013-11-16 LAB — POCT URINALYSIS DIPSTICK
Bilirubin, UA: NEGATIVE
Blood, UA: NEGATIVE
Glucose, UA: NEGATIVE
Ketones, UA: NEGATIVE
LEUKOCYTES UA: NEGATIVE
Nitrite, UA: NEGATIVE
PH UA: 5
PROTEIN UA: NEGATIVE
SPEC GRAV UA: 1.02
UROBILINOGEN UA: NEGATIVE

## 2013-11-16 MED ORDER — ESOMEPRAZOLE MAGNESIUM 40 MG PO CPDR: 40.0000 mg | DELAYED_RELEASE_CAPSULE | Freq: Every day | ORAL | Status: DC

## 2013-11-16 NOTE — Progress Notes (Signed)
   Subjective:    Patient ID: Sarah Dominguez, female    DOB: October 24, 1950, 63 y.o.   MRN: [id number]  HPI She is here for complete examination. She would like her toes looked at as she thinks she might have a fungal infection. She does have underlying allergies and these are under good control. She does use Nexium on an as-needed basis. She notes that certain foods do make it worse. She is now retired and enjoying her retirement. Her marriage is going well. Social and family history was otherwise reviewed. Her immunizations and health maintenance was reviewed. She does have concerns over her weight.   Review of Systems  All other systems reviewed and are negative.      Objective:   Physical Exam BP 130/84  Pulse 77  Ht 5' 8.5" (1.74 m)  Wt 228 lb (103.42 kg)  BMI 34.16 kg/m2  SpO2 97%  General Appearance:    Alert, cooperative, no distress, appears stated age  Head:    Normocephalic, without obvious abnormality, atraumatic  Eyes:    PERRL, conjunctiva/corneas clear, EOM's intact, fundi    benign  Ears:    Normal TM's and external ear canals  Nose:   Nares normal, mucosa normal, no drainage or sinus   tenderness  Throat:   Lips, mucosa, and tongue normal; teeth and gums normal  Neck:   Supple, no lymphadenopathy;  thyroid:  no   enlargement/tenderness/nodules; no carotid   bruit or JVD  Back:    Spine nontender, no curvature, ROM normal, no CVA     tenderness  Lungs:     Clear to auscultation bilaterally without wheezes, rales or     ronchi; respirations unlabored  Chest Wall:    No tenderness or deformity   Heart:    Regular rate and rhythm, S1 and S2 normal, no murmur, rub   or gallop  Breast Exam:    Deferred to GYN  Abdomen:     Soft, non-tender, nondistended, normoactive bowel sounds,    no masses, no hepatosplenomegaly  Genitalia:    Deferred to GYN     Extremities:   No clubbing, cyanosis or edema  Pulses:   2+ and symmetric all extremities  Skin:   Skin color, texture,  turgor normal, no rashes or lesions  Lymph nodes:   Cervical, supraclavicular, and axillary nodes normal  Neurologic:   CNII-XII intact, normal strength, sensation and gait; reflexes 2+ and symmetric throughout          Psych:   Normal mood, affect, hygiene and grooming.          Assessment & Plan:  Routine general medical examination at a health care facility - Plan: CBC with Differential, Comprehensive metabolic panel, Lipid panel  Onychomycosis  Need for prophylactic vaccination and inoculation against influenza - Plan: Flu Vaccine QUAD 36+ mos IM  Obesity (BMI 30-39.9)  Other seasonal allergic rhinitis  Gastroesophageal reflux disease without esophagitis - Plan: esomeprazole (NEXIUM) 40 MG capsule  Hyperlipidemia with target LDL less than 100 - Plan: Lipid panel, POCT Urinalysis Dipstick  continue on present medication regimen. Discussed dietary modification with her especially in regard to reducing carbohydrates. Recommended continuing her activity level but possibly even increasing this. Discussed possible referral to nutritionist however we'll hold off on this. Discussed the treatment of onychomycosis and she has elected to not treat this.

## 2013-11-17 LAB — COMPREHENSIVE METABOLIC PANEL
ALT: 23 U/L (ref 0–35)
AST: 17 U/L (ref 0–37)
Albumin: 4.6 g/dL (ref 3.5–5.2)
Alkaline Phosphatase: 70 U/L (ref 39–117)
BILIRUBIN TOTAL: 0.5 mg/dL (ref 0.2–1.2)
BUN: 17 mg/dL (ref 6–23)
CO2: 23 mEq/L (ref 19–32)
CREATININE: 1.09 mg/dL (ref 0.50–1.10)
Calcium: 10.2 mg/dL (ref 8.4–10.5)
Chloride: 104 mEq/L (ref 96–112)
Glucose, Bld: 93 mg/dL (ref 70–99)
Potassium: 4.8 mEq/L (ref 3.5–5.3)
Sodium: 139 mEq/L (ref 135–145)
Total Protein: 7.1 g/dL (ref 6.0–8.3)

## 2013-11-17 LAB — CBC WITH DIFFERENTIAL/PLATELET
BASOS ABS: 0.1 10*3/uL (ref 0.0–0.1)
BASOS PCT: 1 % (ref 0–1)
EOS ABS: 0.1 10*3/uL (ref 0.0–0.7)
EOS PCT: 2 % (ref 0–5)
HCT: 45 % (ref 36.0–46.0)
Hemoglobin: 15.3 g/dL — ABNORMAL HIGH (ref 12.0–15.0)
Lymphocytes Relative: 22 % (ref 12–46)
Lymphs Abs: 1.2 10*3/uL (ref 0.7–4.0)
MCH: 30.7 pg (ref 26.0–34.0)
MCHC: 34 g/dL (ref 30.0–36.0)
MCV: 90.4 fL (ref 78.0–100.0)
Monocytes Absolute: 0.5 10*3/uL (ref 0.1–1.0)
Monocytes Relative: 10 % (ref 3–12)
Neutro Abs: 3.4 10*3/uL (ref 1.7–7.7)
Neutrophils Relative %: 65 % (ref 43–77)
PLATELETS: 327 10*3/uL (ref 150–400)
RBC: 4.98 MIL/uL (ref 3.87–5.11)
RDW: 13.5 % (ref 11.5–15.5)
WBC: 5.3 10*3/uL (ref 4.0–10.5)

## 2013-11-17 LAB — LIPID PANEL
CHOL/HDL RATIO: 5.3 ratio
Cholesterol: 271 mg/dL — ABNORMAL HIGH (ref 0–200)
HDL: 51 mg/dL (ref >39)
LDL Cholesterol: 182 mg/dL — ABNORMAL HIGH (ref 0–99)
Triglycerides: 190 mg/dL — ABNORMAL HIGH (ref <150)
VLDL: 38 mg/dL (ref 0–40)

## 2013-11-17 MED ORDER — ATORVASTATIN CALCIUM 20 MG PO TABS: 20.0000 mg | ORAL_TABLET | Freq: Every day | ORAL | Status: DC

## 2013-11-17 NOTE — Addendum Note (Signed)
Addended by: Ronnald NianLALONDE, JOHN C on: 11/17/2013 08:23 AM   Modules accepted: Orders

## 2013-11-17 NOTE — Progress Notes (Signed)
   Subjective:    Patient ID: Sarah Dominguez, female    DOB: 05-10-1950, 63 y.o.   MRN: [id number]  HPI    Review of Systems     Objective:   Physical Exam        Assessment & Plan:  In the past she had been on simvastatin for her lipids. I called him talk to her concerning this. I will place her back on simvastatin and recheck in 2 months.

## 2014-11-25 ENCOUNTER — Other Ambulatory Visit: Payer: Self-pay | Admitting: Family Medicine

## 2014-12-19 ENCOUNTER — Other Ambulatory Visit: Payer: Self-pay | Admitting: Family Medicine

## 2014-12-25 ENCOUNTER — Other Ambulatory Visit (INDEPENDENT_AMBULATORY_CARE_PROVIDER_SITE_OTHER): Payer: BLUE CROSS/BLUE SHIELD

## 2014-12-25 DIAGNOSIS — Z23 Encounter for immunization: Secondary | ICD-10-CM

## 2015-02-06 ENCOUNTER — Encounter: Payer: Self-pay | Admitting: Family Medicine

## 2015-02-06 ENCOUNTER — Ambulatory Visit (INDEPENDENT_AMBULATORY_CARE_PROVIDER_SITE_OTHER): Payer: BLUE CROSS/BLUE SHIELD | Admitting: Family Medicine

## 2015-02-06 VITALS — BP 150/90 | HR 82 | Resp 12 | Ht 68.0 in | Wt 236.6 lb

## 2015-02-06 DIAGNOSIS — Z1159 Encounter for screening for other viral diseases: Secondary | ICD-10-CM

## 2015-02-06 DIAGNOSIS — E785 Hyperlipidemia, unspecified: Secondary | ICD-10-CM

## 2015-02-06 DIAGNOSIS — E669 Obesity, unspecified: Secondary | ICD-10-CM

## 2015-02-06 DIAGNOSIS — B079 Viral wart, unspecified: Secondary | ICD-10-CM | POA: Diagnosis not present

## 2015-02-06 DIAGNOSIS — J302 Other seasonal allergic rhinitis: Secondary | ICD-10-CM

## 2015-02-06 DIAGNOSIS — M1990 Unspecified osteoarthritis, unspecified site: Secondary | ICD-10-CM

## 2015-02-06 DIAGNOSIS — Z0000 Encounter for general adult medical examination without abnormal findings: Secondary | ICD-10-CM | POA: Diagnosis not present

## 2015-02-06 DIAGNOSIS — K219 Gastro-esophageal reflux disease without esophagitis: Secondary | ICD-10-CM

## 2015-02-06 LAB — CBC WITH DIFFERENTIAL/PLATELET
BASOS ABS: 0.1 10*3/uL (ref 0.0–0.1)
Basophils Relative: 1 % (ref 0–1)
EOS ABS: 0.1 10*3/uL (ref 0.0–0.7)
EOS PCT: 2 % (ref 0–5)
HCT: 44.9 % (ref 36.0–46.0)
Hemoglobin: 14.8 g/dL (ref 12.0–15.0)
LYMPHS ABS: 1.2 10*3/uL (ref 0.7–4.0)
Lymphocytes Relative: 24 % (ref 12–46)
MCH: 30.3 pg (ref 26.0–34.0)
MCHC: 33 g/dL (ref 30.0–36.0)
MCV: 92 fL (ref 78.0–100.0)
MONO ABS: 0.5 10*3/uL (ref 0.1–1.0)
MPV: 9.9 fL (ref 8.6–12.4)
Monocytes Relative: 10 % (ref 3–12)
Neutro Abs: 3.2 10*3/uL (ref 1.7–7.7)
Neutrophils Relative %: 63 % (ref 43–77)
PLATELETS: 315 10*3/uL (ref 150–400)
RBC: 4.88 MIL/uL (ref 3.87–5.11)
RDW: 13.3 % (ref 11.5–15.5)
WBC: 5.1 10*3/uL (ref 4.0–10.5)

## 2015-02-06 LAB — COMPREHENSIVE METABOLIC PANEL
ALT: 22 U/L (ref 6–29)
AST: 19 U/L (ref 10–35)
Albumin: 4.6 g/dL (ref 3.6–5.1)
Alkaline Phosphatase: 68 U/L (ref 33–130)
BILIRUBIN TOTAL: 0.6 mg/dL (ref 0.2–1.2)
BUN: 15 mg/dL (ref 7–25)
CO2: 24 mmol/L (ref 20–31)
CREATININE: 0.86 mg/dL (ref 0.50–0.99)
Calcium: 9.9 mg/dL (ref 8.6–10.4)
Chloride: 105 mmol/L (ref 98–110)
GLUCOSE: 98 mg/dL (ref 65–99)
Potassium: 4.7 mmol/L (ref 3.5–5.3)
Sodium: 141 mmol/L (ref 135–146)
Total Protein: 6.8 g/dL (ref 6.1–8.1)

## 2015-02-06 LAB — LIPID PANEL
Cholesterol: 188 mg/dL (ref 125–200)
HDL: 65 mg/dL (ref >=46)
LDL Cholesterol: 101 mg/dL (ref <130)
Total CHOL/HDL Ratio: 2.9 Ratio (ref <=5.0)
Triglycerides: 109 mg/dL (ref <150)
VLDL: 22 mg/dL (ref <30)

## 2015-02-06 NOTE — Patient Instructions (Signed)
150 minutes a week of something physical Cut back on "White food"

## 2015-02-06 NOTE — Progress Notes (Signed)
Subjective:    Patient ID: Sarah Dominguez, female    DOB: [birth year], 64 y.o.   MRN: [id number]  HPI  she is here for complete examination. She does complain of pain in the right thumb at the MCP joint. She also has a lesion present on the right foot over the fifth metatarsal. Her allergies seem to be under good control. She does have reflux disease but presently is on no medication for this. Review his record also indicates elevated lipid panel. Her exercise is minimal at roughly 2 days per week. She states that she is too busy to work it in more than this. She  Does not work. Her marriage is going well. She does not complain of chest pain, shortness of breath, difficulty with swallowing. She has had a hysterectomy. Family and social history as well as health maintenance and immunizations were reviewed.   Review of Systems  All other systems reviewed and are negative.      Objective:   Physical Exam BP 150/90 mmHg  Pulse 82  Resp 12  Ht  (1.727 m)  Wt 236 lb 9.6 oz (107.321 kg)  BMI 35.98 kg/m2  General Appearance:    Alert, cooperative, no distress, appears stated age  Head:    Normocephalic, without obvious abnormality, atraumatic  Eyes:    PERRL, conjunctiva/corneas clear, EOM's intact, fundi    benign  Ears:    Normal TM's and external ear canals  Nose:   Nares normal, mucosa normal, no drainage or sinus   tenderness  Throat:   Lips, mucosa, and tongue normal; teeth and gums normal  Neck:   Supple, no lymphadenopathy;  thyroid:  no   enlargement/tenderness/nodules; no carotid   bruit or JVD  Back:    Spine nontender, no curvature, ROM normal, no CVA     tenderness  Lungs:     Clear to auscultation bilaterally without wheezes, rales or     ronchi; respirations unlabored  Chest Wall:    No tenderness or deformity   Heart:    Regular rate and rhythm, S1 and S2 normal, no murmur, rub   or gallop  Breast Exam:    Deferred to GYN  Abdomen:     Soft, non-tender,  nondistended, normoactive bowel sounds,    no masses, no hepatosplenomegaly  Genitalia:    Deferred to GYN     Extremities:   No clubbing, cyanosis or edema. Full motion of the right thumb with some pain over the MCP joint. Exam of the right foot does show a small wart present over the fifth metatarsal. Left foot does have some callus formation but no evidence of wart.  Pulses:   2+ and symmetric all extremities  Skin:   Skin color, texture, turgor normal, no rashes or lesions  Lymph nodes:   Cervical, supraclavicular, and axillary nodes normal  Neurologic:   CNII-XII intact, normal strength, sensation and gait; reflexes 2+ and symmetric throughout          Psych:   Normal mood, affect, hygiene and grooming.          Assessment & Plan:  Routine general medical examination at a health care facility - Plan: CBC with Differential/Platelet, Comprehensive metabolic panel, Lipid panel  Other seasonal allergic rhinitis  Obesity (BMI 30-39.9) - Plan: CBC with Differential/Platelet, Comprehensive metabolic panel, Lipid panel  Gastroesophageal reflux disease without esophagitis  Hyperlipidemia with target LDL less than 100  Need for hepatitis C screening test - Plan: Hepatitis  C antibody  Arthritis  Wart  she will continue to treat her allergies as needed. She also will continue to treat reflux on an as-needed basis. Discussed treatment of arthritis with her and strongly encouraged the use of Compound W on a cyclic basis. Demonstrated how to do this. Discussed arthritis with her and recommend using Tylenol first and going to an anti-inflammatory of choice. Also discussed her weight in regard to diet and exercise. Encouraged 150 minutes a week of something physical as well as cutting back on carbohydrates.

## 2015-02-07 LAB — HEPATITIS C ANTIBODY: HCV AB: NEGATIVE

## 2015-03-12 ENCOUNTER — Ambulatory Visit: Payer: BLUE CROSS/BLUE SHIELD | Admitting: Family Medicine

## 2015-03-14 ENCOUNTER — Other Ambulatory Visit: Payer: Self-pay | Admitting: Family Medicine

## 2015-03-14 ENCOUNTER — Ambulatory Visit (INDEPENDENT_AMBULATORY_CARE_PROVIDER_SITE_OTHER): Payer: BLUE CROSS/BLUE SHIELD | Admitting: Family Medicine

## 2015-03-14 ENCOUNTER — Other Ambulatory Visit: Payer: Self-pay | Admitting: *Deleted

## 2015-03-14 ENCOUNTER — Encounter: Payer: Self-pay | Admitting: Family Medicine

## 2015-03-14 VITALS — BP 130/92 | HR 76 | Wt 238.0 lb

## 2015-03-14 DIAGNOSIS — E669 Obesity, unspecified: Secondary | ICD-10-CM

## 2015-03-14 DIAGNOSIS — R030 Elevated blood-pressure reading, without diagnosis of hypertension: Secondary | ICD-10-CM

## 2015-03-14 NOTE — Progress Notes (Signed)
   Subjective:    Patient ID: Sarah Dominguez, female    DOB: 28-Dec-1950, 65 y.o.   MRN: [id number]  HPI She is here for a recheck on her blood pressure. She has started an exercise program. She is quite frustrated over her lack of weight loss. She usually has 2 meals per day. She states healthy.   Review of Systems     Objective:   Physical Exam Urgent and in no distress. Blood pressure is recorded.       Assessment & Plan:  Obesity (BMI 30-39.9) - Plan: Amb ref to Medical Nutrition Therapy-MNT  Elevated blood pressure (not hypertension) I encouraged her to continue with her exercise program we then discussed diet as well as. She states that she eats a healthy meal and proper proportions. After discussion with her, I will refer to nutrition for further counseling concerning this.

## 2015-03-21 ENCOUNTER — Other Ambulatory Visit: Payer: Self-pay | Admitting: Family Medicine

## 2015-03-22 NOTE — Telephone Encounter (Signed)
Forwarding to Dr. Lalonde ?

## 2015-03-26 MED ORDER — ATORVASTATIN CALCIUM 20 MG PO TABS: 20.0000 mg | ORAL_TABLET | Freq: Every day | ORAL | Status: DC

## 2015-03-27 ENCOUNTER — Ambulatory Visit: Payer: Self-pay | Admitting: Dietician

## 2015-04-03 ENCOUNTER — Encounter: Payer: BLUE CROSS/BLUE SHIELD | Attending: Family Medicine | Admitting: Dietician

## 2015-04-03 ENCOUNTER — Encounter: Payer: Self-pay | Admitting: Dietician

## 2015-04-03 VITALS — Ht 68.0 in | Wt 237.0 lb

## 2015-04-03 DIAGNOSIS — E669 Obesity, unspecified: Secondary | ICD-10-CM | POA: Insufficient documentation

## 2015-04-03 DIAGNOSIS — Z6836 Body mass index (BMI) 36.0-36.9, adult: Secondary | ICD-10-CM | POA: Insufficient documentation

## 2015-04-03 NOTE — Patient Instructions (Signed)
Stay active.  You are doing a great job! Increase your water intake (64-80 ounces) Listen to your body. Be mindful of portion sizes. Choose lean meats Read your labels, low saturated fat and less fat in general Have a small amount of protein with lunch (dried beans, small portion cheese or meat, egg, tuna) Choose more non starchy vegetable, dried beans, whole grains (for increased fiber)

## 2015-04-03 NOTE — Progress Notes (Signed)
  Medical Nutrition Therapy:  Appt start time: 1045 end time:  1130.   Assessment:  Primary concerns today: Patient is here alone.  She is here due to obesity.  "I hardly eat anything but can't lose weight"  Both parents were obese and does not want to gain further.  Hx includes hyperlipidemia.  Sleeps 7-8 hours.  Patient lives with her husband.  She shops and cooks.  She is retired.  She was a hairdresser in the past.  Moved from [place] in 2009.    Weight hx: 9th grade 135 lbs Lowest adult weight:  150 lbs at 65 yo but 175 lbs usually Highest adult weight:  237 lbs (today) 215 lbs last year Goal 200 lbs.  TANITA  BODY COMP RESULTS 04/03/15 237 lbs   BMI (kg/m^2) 36   Fat Mass (lbs) 124.5   Fat Free Mass (lbs) 112.5   Total Body Water (lbs) 82.5   Preferred Learning Style:   No preference indicated   Learning Readiness:   Ready  Change in progress  MEDICATIONS: see list   DIETARY INTAKE:  Usual eating pattern includes 3 meals and 1 snacks per day. Avoided foods include "creamy or whipped"  "Feel sick after cheese cake or mouse.", generally fried food.  24-hr recall:  B (8:30-9 AM): eggs, bacon, toast OR french toast and sausage OR pancakes and bacon or sausage OR breakfast sandwich with ham, cheese, egg on potato bread  Snk ( AM):   L ( PM): salad (vegetables, croutons with vinaigrette or ranch and occasional protein)   Snk ( PM): apple or orange D (6-8 PM): meat, vegetable, rice or potatoes or corn or pasta and/or bread OR homemade soup with bread or crackers  Occasional dessert Snk ( PM): none Beverages: 4 oz OJ, water (24-32 ounces daily), coffee with half and half, soda occasionally, unsweetened tea  Usual physical activity: [employer] 2-3 times per week 4 miles cardio and the rest weight bearing.  A lot of yard work (trimming shrubs, mowing lawn, raking, etc.)  Estimated energy needs: 1800 calories 75 g protein  Progress Towards Goal(s):  In  progress.   Nutritional Diagnosis:  NB-1.1 Food and nutrition-related knowledge deficit As related to balance of energy expenditure and intake for weight maintenance/loss.  As evidenced by patient report.    Intervention:  Nutrition education/counseling regarding mindful eating and nutrition for weight management.  Stay active.  You are doing a great job! Increase your water intake (64-80 ounces) Listen to your body. Be mindful of portion sizes. Choose lean meats Read your labels, low saturated fat and less fat in general Have a small amount of protein with lunch (dried beans, small portion cheese or meat, egg, tuna) Choose more non starchy vegetable, dried beans, whole grains (for increased fiber)  Teaching Method Utilized:  Visual Auditory Hands on  Handouts given during visit include:  My plate  Weight loss resources  Barriers to learning/adherence to lifestyle change: none  Demonstrated degree of understanding via:  Teach Back   Monitoring/Evaluation:  Dietary intake, exercise, and body weight prn.

## 2015-12-19 ENCOUNTER — Other Ambulatory Visit (INDEPENDENT_AMBULATORY_CARE_PROVIDER_SITE_OTHER): Payer: BLUE CROSS/BLUE SHIELD

## 2015-12-19 DIAGNOSIS — Z23 Encounter for immunization: Secondary | ICD-10-CM

## 2016-02-26 ENCOUNTER — Telehealth: Payer: Self-pay | Admitting: Family Medicine

## 2016-02-26 NOTE — Telephone Encounter (Signed)
Wife called to change pharm for her and [facility]mit[facility]Bruce [facility] [place].

## 2016-05-26 ENCOUNTER — Other Ambulatory Visit: Payer: Self-pay | Admitting: Family Medicine

## 2016-06-05 ENCOUNTER — Encounter: Payer: Self-pay | Admitting: Family Medicine

## 2016-06-05 ENCOUNTER — Ambulatory Visit (INDEPENDENT_AMBULATORY_CARE_PROVIDER_SITE_OTHER): Payer: BLUE CROSS/BLUE SHIELD | Admitting: Family Medicine

## 2016-06-05 VITALS — BP 140/90 | HR 89 | Ht 68.0 in | Wt 238.0 lb

## 2016-06-05 DIAGNOSIS — R030 Elevated blood-pressure reading, without diagnosis of hypertension: Secondary | ICD-10-CM

## 2016-06-05 DIAGNOSIS — M1990 Unspecified osteoarthritis, unspecified site: Secondary | ICD-10-CM

## 2016-06-05 DIAGNOSIS — Z13820 Encounter for screening for osteoporosis: Secondary | ICD-10-CM | POA: Diagnosis not present

## 2016-06-05 DIAGNOSIS — Z23 Encounter for immunization: Secondary | ICD-10-CM

## 2016-06-05 DIAGNOSIS — J309 Allergic rhinitis, unspecified: Secondary | ICD-10-CM

## 2016-06-05 DIAGNOSIS — E785 Hyperlipidemia, unspecified: Secondary | ICD-10-CM | POA: Diagnosis not present

## 2016-06-05 DIAGNOSIS — E669 Obesity, unspecified: Secondary | ICD-10-CM | POA: Diagnosis not present

## 2016-06-05 DIAGNOSIS — K219 Gastro-esophageal reflux disease without esophagitis: Secondary | ICD-10-CM

## 2016-06-05 DIAGNOSIS — Z79899 Other long term (current) drug therapy: Secondary | ICD-10-CM | POA: Diagnosis not present

## 2016-06-05 LAB — COMPREHENSIVE METABOLIC PANEL
ALT: 22 U/L (ref 6–29)
AST: 18 U/L (ref 10–35)
Albumin: 4.4 g/dL (ref 3.6–5.1)
Alkaline Phosphatase: 69 U/L (ref 33–130)
BILIRUBIN TOTAL: 0.5 mg/dL (ref 0.2–1.2)
BUN: 14 mg/dL (ref 7–25)
CALCIUM: 10.2 mg/dL (ref 8.6–10.4)
CHLORIDE: 104 mmol/L (ref 98–110)
CO2: 24 mmol/L (ref 20–31)
Creat: 0.92 mg/dL (ref 0.50–0.99)
GLUCOSE: 101 mg/dL — AB (ref 65–99)
Potassium: 4.1 mmol/L (ref 3.5–5.3)
SODIUM: 141 mmol/L (ref 135–146)
Total Protein: 6.8 g/dL (ref 6.1–8.1)

## 2016-06-05 LAB — CBC WITH DIFFERENTIAL/PLATELET
BASOS ABS: 56 {cells}/uL (ref 0–200)
Basophils Relative: 1 %
EOS PCT: 2 %
Eosinophils Absolute: 112 cells/uL (ref 15–500)
HEMATOCRIT: 44.3 % (ref 35.0–45.0)
Hemoglobin: 14.8 g/dL (ref 11.7–15.5)
LYMPHS PCT: 28 %
Lymphs Abs: 1568 cells/uL (ref 850–3900)
MCH: 31.2 pg (ref 27.0–33.0)
MCHC: 33.4 g/dL (ref 32.0–36.0)
MCV: 93.5 fL (ref 80.0–100.0)
MPV: 9.8 fL (ref 7.5–12.5)
Monocytes Absolute: 448 cells/uL (ref 200–950)
Monocytes Relative: 8 %
NEUTROS PCT: 61 %
Neutro Abs: 3416 cells/uL (ref 1500–7800)
Platelets: 281 10*3/uL (ref 140–400)
RBC: 4.74 MIL/uL (ref 3.80–5.10)
RDW: 13.3 % (ref 11.0–15.0)
WBC: 5.6 10*3/uL (ref 4.0–10.5)

## 2016-06-05 LAB — LIPID PANEL
Cholesterol: 182 mg/dL (ref <200)
HDL: 58 mg/dL (ref >50)
LDL CALC: 97 mg/dL (ref <100)
TRIGLYCERIDES: 137 mg/dL (ref <150)
Total CHOL/HDL Ratio: 3.1 Ratio (ref <5.0)
VLDL: 27 mg/dL (ref <30)

## 2016-06-05 NOTE — Patient Instructions (Signed)
20 minutes a day of something physical Cut back on carbohydrates. The easiest way to remember this is cut back on "white food"

## 2016-06-05 NOTE — Progress Notes (Signed)
   Subjective:    Patient ID: Sarah Dominguez, female    DOB: 02-11-1951, 66 y.o.   MRN: [id number]  HPI He is here for medication check. She is taking Lipitor and having no aches and pains with this. She also takes a good multivitamin. She is using Nexium on an as-needed basis. She is having no difficulty with this but expresses concerns over possible side effects. Her allergies are under good control and she is using OTC meds for this. She has made some dietary changes stating she is watching what she eats. She is physically active but has been not going to the gym as much is possible. She plans to go at least twice per week. She does have arthritis of various joints but nothing specific. She is now 66. She does not smoke. She has not had a bone density scan. Her immunizations were reviewed.   Review of Systems     Objective:   Physical Exam Alert and in no distress. Tympanic membranes and canals are normal. Pharyngeal area is normal. Neck is supple without adenopathy or thyromegaly. Cardiac exam shows a regular sinus rhythm without murmurs or gallops. Lungs are clear to auscultation.        Assessment & Plan:  Hyperlipidemia with target LDL less than 100 - Plan: Lipid panel  Gastroesophageal reflux disease without esophagitis - Plan: CBC with Differential/Platelet, Comprehensive metabolic panel  Allergic rhinitis, unspecified seasonality, unspecified trigger  Obesity (BMI 30-39.9) - Plan: CBC with Differential/Platelet, Comprehensive metabolic panel  Arthritis  Elevated blood pressure reading - Plan: CBC with Differential/Platelet, Comprehensive metabolic panel, Lipid panel  Need for shingles vaccine - Plan: Varicella-zoster vaccine IM (Shingrix)  Need for vaccination with 13-polyvalent pneumococcal conjugate vaccine - Plan: Pneumococcal conjugate vaccine 13-valent IM  Encounter for long-term (current) use of medications  Screening for osteoporosis - Plan: DG Bone Density She  is stable on her present medication regimen on the above diagnoses. Did encourage her to increase her physical activity. He is also to make further adjustments in her diet by cutting back on carbohydrates. She will bring in her blood pressure cuff with her next visit to compare against ours as she does state she thinks she has whitecoat hypertension. Her immunizations were updated. Continue to conservatively treat her arthritis. She will set up for a mammogram and I recommend getting and the 3-D variety.

## 2016-06-06 MED ORDER — ESOMEPRAZOLE MAGNESIUM 40 MG PO CPDR: 40.0000 mg | DELAYED_RELEASE_CAPSULE | ORAL | 3 refills | Status: DC | PRN

## 2016-06-06 MED ORDER — ATORVASTATIN CALCIUM 20 MG PO TABS: 20.0000 mg | ORAL_TABLET | Freq: Every day | ORAL | 3 refills | Status: DC

## 2016-06-06 NOTE — Addendum Note (Signed)
Addended by: Ronnald Nian on: 06/06/2016 11:06 AM   Modules accepted: Orders

## 2016-06-09 LAB — HM MAMMOGRAPHY

## 2016-06-17 ENCOUNTER — Encounter: Payer: Self-pay | Admitting: Family Medicine

## 2016-08-07 ENCOUNTER — Ambulatory Visit (INDEPENDENT_AMBULATORY_CARE_PROVIDER_SITE_OTHER): Payer: BLUE CROSS/BLUE SHIELD | Admitting: Family Medicine

## 2016-08-07 ENCOUNTER — Encounter: Payer: Self-pay | Admitting: Family Medicine

## 2016-08-07 VITALS — BP 130/80 | HR 78 | Ht 68.0 in | Wt 220.0 lb

## 2016-08-07 DIAGNOSIS — K219 Gastro-esophageal reflux disease without esophagitis: Secondary | ICD-10-CM | POA: Diagnosis not present

## 2016-08-07 DIAGNOSIS — R030 Elevated blood-pressure reading, without diagnosis of hypertension: Secondary | ICD-10-CM | POA: Diagnosis not present

## 2016-08-07 DIAGNOSIS — E669 Obesity, unspecified: Secondary | ICD-10-CM

## 2016-08-07 DIAGNOSIS — Z23 Encounter for immunization: Secondary | ICD-10-CM

## 2016-08-07 NOTE — Progress Notes (Signed)
   Subjective:    Patient ID: Eino FarberGeorgette Dominguez, female    DOB: [birth year], 66 y.o.   MRN: [id number]  HPI She is here for a recheck. His last being seen she has made major changes in her diet specifically cutting out carbohydrates. She notes that she is no longer having reflux symptoms, has more energy and stamina and has lost 18 pounds. She is very happy with this.   Review of Systems     Objective:   Physical Exam Alert and in no distress. Blood pressure and weight are recorded.       Assessment & Plan:  Elevated blood pressure reading  Need for shingles vaccine - Plan: Varicella-zoster vaccine IM (Shingrix)  Gastroesophageal reflux disease without esophagitis  Obesity (BMI 30-39.9)  I complemented her on the good work that she is doing and strongly encouraged her to continue with her present lifestyle and eating habits. I explained that I would like to see her get down to a size 14 dressed size. Recommend she not place time and energy worrying about her weight.

## 2016-12-25 ENCOUNTER — Other Ambulatory Visit (INDEPENDENT_AMBULATORY_CARE_PROVIDER_SITE_OTHER): Payer: BLUE CROSS/BLUE SHIELD

## 2016-12-25 DIAGNOSIS — Z23 Encounter for immunization: Secondary | ICD-10-CM | POA: Diagnosis not present

## 2017-01-06 ENCOUNTER — Encounter: Payer: Self-pay | Admitting: Family Medicine

## 2017-01-06 ENCOUNTER — Ambulatory Visit: Payer: BLUE CROSS/BLUE SHIELD | Admitting: Family Medicine

## 2017-01-06 VITALS — BP 132/70 | HR 85 | Temp 98.4°F | Resp 16 | Wt 202.4 lb

## 2017-01-06 DIAGNOSIS — N3091 Cystitis, unspecified with hematuria: Secondary | ICD-10-CM | POA: Diagnosis not present

## 2017-01-06 DIAGNOSIS — R350 Frequency of micturition: Secondary | ICD-10-CM

## 2017-01-06 LAB — POCT URINALYSIS DIP (PROADVANTAGE DEVICE)
Bilirubin, UA: NEGATIVE
Glucose, UA: NEGATIVE mg/dL
Ketones, POC UA: NEGATIVE mg/dL
NITRITE UA: NEGATIVE
SPECIFIC GRAVITY, URINE: 1.025
UUROB: NEGATIVE
pH, UA: 6 (ref 5.0–8.0)

## 2017-01-06 MED ORDER — SULFAMETHOXAZOLE-TRIMETHOPRIM 800-160 MG PO TABS: 1.0000 | ORAL_TABLET | Freq: Two times a day (BID) | ORAL | 0 refills | Status: AC

## 2017-01-06 NOTE — Patient Instructions (Signed)
Drink plenty fluids and also use Azo-Standard.  Take all the antibiotic and return here in about 2 weeks

## 2017-01-06 NOTE — Progress Notes (Signed)
   Subjective:    Patient ID: Sarah Dominguez, female    DOB: 03/01/1950, 66 y.o.   MRN: [id number]  HPI She has a 1 day history of hematuria, urgency, lower abdominal pressure and frequency.  No fever or chills.   Review of Systems     Objective:   Physical Exam Alert and in no distress.  No abdominal discomfort on palpation.  Urine dipstick was positive for white cells and red cells.       Assessment & Plan:  Hemorrhagic cystitis - Plan: sulfamethoxazole-trimethoprim (BACTRIM DS,SEPTRA DS) 800-160 MG tablet  Urinary frequency - Plan: POCT Urinalysis DIP (Proadvantage Device)  She will return here in 2 weeks for a nurse visit urinalysis.  Also recommend using Azo-Standard to help with her symptoms.

## 2017-01-22 ENCOUNTER — Other Ambulatory Visit (INDEPENDENT_AMBULATORY_CARE_PROVIDER_SITE_OTHER): Payer: BLUE CROSS/BLUE SHIELD

## 2017-01-22 DIAGNOSIS — R82998 Other abnormal findings in urine: Secondary | ICD-10-CM

## 2017-01-22 LAB — POCT URINALYSIS DIP (PROADVANTAGE DEVICE)
BILIRUBIN UA: NEGATIVE
GLUCOSE UA: NEGATIVE mg/dL
Ketones, POC UA: NEGATIVE mg/dL
Leukocytes, UA: NEGATIVE
NITRITE UA: NEGATIVE
Protein Ur, POC: NEGATIVE mg/dL
RBC UA: NEGATIVE
Specific Gravity, Urine: 1.01
UUROB: NEGATIVE
pH, UA: 6 (ref 5.0–8.0)

## 2017-12-01 ENCOUNTER — Other Ambulatory Visit (INDEPENDENT_AMBULATORY_CARE_PROVIDER_SITE_OTHER): Payer: BLUE CROSS/BLUE SHIELD

## 2017-12-01 DIAGNOSIS — Z23 Encounter for immunization: Secondary | ICD-10-CM

## 2018-05-13 ENCOUNTER — Encounter: Payer: Self-pay | Admitting: Gastroenterology

## 2018-07-14 DIAGNOSIS — Z803 Family history of malignant neoplasm of breast: Secondary | ICD-10-CM | POA: Diagnosis not present

## 2018-07-14 DIAGNOSIS — Z1231 Encounter for screening mammogram for malignant neoplasm of breast: Secondary | ICD-10-CM | POA: Diagnosis not present

## 2018-07-14 LAB — HM MAMMOGRAPHY

## 2018-08-05 DIAGNOSIS — H2513 Age-related nuclear cataract, bilateral: Secondary | ICD-10-CM | POA: Diagnosis not present

## 2018-08-05 DIAGNOSIS — H524 Presbyopia: Secondary | ICD-10-CM | POA: Diagnosis not present

## 2018-12-24 ENCOUNTER — Other Ambulatory Visit: Payer: Self-pay

## 2018-12-24 ENCOUNTER — Other Ambulatory Visit (INDEPENDENT_AMBULATORY_CARE_PROVIDER_SITE_OTHER): Payer: BC Managed Care – PPO

## 2018-12-24 DIAGNOSIS — Z23 Encounter for immunization: Secondary | ICD-10-CM

## 2019-08-19 DIAGNOSIS — H524 Presbyopia: Secondary | ICD-10-CM | POA: Diagnosis not present

## 2019-08-19 DIAGNOSIS — H04123 Dry eye syndrome of bilateral lacrimal glands: Secondary | ICD-10-CM | POA: Diagnosis not present

## 2019-08-19 DIAGNOSIS — H2513 Age-related nuclear cataract, bilateral: Secondary | ICD-10-CM | POA: Diagnosis not present

## 2021-10-23 ENCOUNTER — Encounter: Payer: Self-pay | Admitting: Internal Medicine

## 2021-11-26 ENCOUNTER — Encounter: Payer: Self-pay | Admitting: Internal Medicine

## 2021-12-09 ENCOUNTER — Encounter: Payer: Self-pay | Admitting: Internal Medicine

## 2022-07-25 ENCOUNTER — Telehealth: Payer: Self-pay | Admitting: Family Medicine

## 2022-07-25 NOTE — Telephone Encounter (Signed)
[facility] medical records request to [employer]
